# Patient Record
Sex: Female | Born: 1975 | Race: Asian | Hispanic: No | Marital: Married
Health system: Southern US, Community
[De-identification: ages and names within clinical notes are randomized; demographics above are authoritative.]

---

## 2016-02-27 ENCOUNTER — Encounter: Payer: Self-pay | Admitting: *Deleted

## 2020-08-18 ENCOUNTER — Emergency Department (HOSPITAL_COMMUNITY): Payer: BLUE CROSS/BLUE SHIELD

## 2020-08-18 ENCOUNTER — Emergency Department (HOSPITAL_COMMUNITY)
Admission: EM | Admit: 2020-08-18 | Discharge: 2020-08-19 | Disposition: A | Discharge status: Home / Self Care | Payer: BLUE CROSS/BLUE SHIELD | Attending: Emergency Medicine | Admitting: Emergency Medicine

## 2020-08-18 ENCOUNTER — Other Ambulatory Visit: Payer: Self-pay

## 2020-08-18 ENCOUNTER — Encounter (HOSPITAL_COMMUNITY): Payer: Self-pay

## 2020-08-18 DIAGNOSIS — S80212A Abrasion, left knee, initial encounter: Secondary | ICD-10-CM | POA: Diagnosis not present

## 2020-08-18 DIAGNOSIS — S39012A Strain of muscle, fascia and tendon of lower back, initial encounter: Secondary | ICD-10-CM | POA: Insufficient documentation

## 2020-08-18 DIAGNOSIS — S0990XA Unspecified injury of head, initial encounter: Secondary | ICD-10-CM | POA: Diagnosis not present

## 2020-08-18 DIAGNOSIS — S199XXA Unspecified injury of neck, initial encounter: Secondary | ICD-10-CM | POA: Diagnosis present

## 2020-08-18 DIAGNOSIS — T07XXXA Unspecified multiple injuries, initial encounter: Secondary | ICD-10-CM

## 2020-08-18 DIAGNOSIS — S40212A Abrasion of left shoulder, initial encounter: Secondary | ICD-10-CM | POA: Insufficient documentation

## 2020-08-18 DIAGNOSIS — S161XXA Strain of muscle, fascia and tendon at neck level, initial encounter: Secondary | ICD-10-CM | POA: Insufficient documentation

## 2020-08-18 DIAGNOSIS — Y92410 Unspecified street and highway as the place of occurrence of the external cause: Secondary | ICD-10-CM | POA: Insufficient documentation

## 2020-08-18 DIAGNOSIS — S8012XA Contusion of left lower leg, initial encounter: Secondary | ICD-10-CM | POA: Insufficient documentation

## 2020-08-18 DIAGNOSIS — S60012A Contusion of left thumb without damage to nail, initial encounter: Secondary | ICD-10-CM | POA: Insufficient documentation

## 2020-08-18 MED ORDER — CYCLOBENZAPRINE HCL 10 MG PO TABS: 10.0000 mg | ORAL_TABLET | Freq: Two times a day (BID) | ORAL | 0 refills | Status: AC | PRN
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
## 2020-08-18 NOTE — ED Notes (Signed)
Pt refused I-Stat beta hCG
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
